# Patient Record
Sex: Male | Born: 1980 | Race: Black or African American | Hispanic: No | Marital: Married | Smoking: Current every day smoker
Health system: Southern US, Community
[De-identification: ages and names within clinical notes are randomized; demographics above are authoritative.]

## PROBLEM LIST (undated history)

## (undated) HISTORY — PX: HAND SURGERY: SHX662

---

## 2008-07-21 ENCOUNTER — Emergency Department: Payer: Self-pay | Admitting: Emergency Medicine

## 2009-08-25 ENCOUNTER — Emergency Department: Payer: Self-pay | Admitting: Unknown Physician Specialty

## 2011-01-05 ENCOUNTER — Emergency Department: Payer: Self-pay | Admitting: Emergency Medicine

## 2015-05-13 ENCOUNTER — Encounter: Payer: Self-pay | Admitting: Emergency Medicine

## 2015-05-13 ENCOUNTER — Emergency Department: Payer: Self-pay

## 2015-05-13 ENCOUNTER — Inpatient Hospital Stay
Admission: EM | Admit: 2015-05-13 | Discharge: 2015-05-13 | DRG: 558 | Disposition: A | Discharge status: Home / Self Care | Payer: Self-pay | Attending: Internal Medicine | Admitting: Internal Medicine

## 2015-05-13 DIAGNOSIS — Z9889 Other specified postprocedural states: Secondary | ICD-10-CM

## 2015-05-13 DIAGNOSIS — R079 Chest pain, unspecified: Secondary | ICD-10-CM

## 2015-05-13 DIAGNOSIS — F1510 Other stimulant abuse, uncomplicated: Secondary | ICD-10-CM

## 2015-05-13 DIAGNOSIS — Z8249 Family history of ischemic heart disease and other diseases of the circulatory system: Secondary | ICD-10-CM

## 2015-05-13 DIAGNOSIS — E860 Dehydration: Secondary | ICD-10-CM | POA: Diagnosis present

## 2015-05-13 DIAGNOSIS — M6282 Rhabdomyolysis: Principal | ICD-10-CM | POA: Diagnosis present

## 2015-05-13 DIAGNOSIS — F17200 Nicotine dependence, unspecified, uncomplicated: Secondary | ICD-10-CM | POA: Diagnosis present

## 2015-05-13 DIAGNOSIS — F1590 Other stimulant use, unspecified, uncomplicated: Secondary | ICD-10-CM | POA: Diagnosis present

## 2015-05-13 DIAGNOSIS — R252 Cramp and spasm: Secondary | ICD-10-CM

## 2015-05-13 LAB — COMPREHENSIVE METABOLIC PANEL
ALBUMIN: 4.1 g/dL (ref 3.5–5.0)
ALK PHOS: 56 U/L (ref 38–126)
ALT: 26 U/L (ref 17–63)
ALT: 21 U/L (ref 17–63)
AST: 50 U/L — ABNORMAL HIGH (ref 15–41)
AST: 36 U/L (ref 15–41)
Albumin: 5.4 g/dL — ABNORMAL HIGH (ref 3.5–5.0)
Alkaline Phosphatase: 75 U/L (ref 38–126)
Anion gap: 19 — ABNORMAL HIGH (ref 5–15)
Anion gap: 8 (ref 5–15)
BILIRUBIN TOTAL: 1.8 mg/dL — AB (ref 0.3–1.2)
BUN: 7 mg/dL (ref 6–20)
BUN: 7 mg/dL (ref 6–20)
CALCIUM: 11 mg/dL — AB (ref 8.9–10.3)
CALCIUM: 9.3 mg/dL (ref 8.9–10.3)
CHLORIDE: 95 mmol/L — AB (ref 101–111)
CO2: 26 mmol/L (ref 22–32)
CO2: 26 mmol/L (ref 22–32)
CREATININE: 1.04 mg/dL (ref 0.61–1.24)
CREATININE: 0.75 mg/dL (ref 0.61–1.24)
Chloride: 102 mmol/L (ref 101–111)
GFR calc Af Amer: >60 mL/min (ref >60)
GFR calc non Af Amer: >60 mL/min (ref >60)
GLUCOSE: 102 mg/dL — AB (ref 65–99)
Glucose, Bld: 117 mg/dL — ABNORMAL HIGH (ref 65–99)
Potassium: 4.8 mmol/L (ref 3.5–5.1)
Potassium: 3.9 mmol/L (ref 3.5–5.1)
SODIUM: 136 mmol/L (ref 135–145)
Sodium: 140 mmol/L (ref 135–145)
TOTAL PROTEIN: 9.4 g/dL — AB (ref 6.5–8.1)
TOTAL PROTEIN: 7.3 g/dL (ref 6.5–8.1)
Total Bilirubin: 1.7 mg/dL — ABNORMAL HIGH (ref 0.3–1.2)

## 2015-05-13 LAB — URINALYSIS COMPLETE WITH MICROSCOPIC (ARMC ONLY)
BILIRUBIN URINE: NEGATIVE
Bacteria, UA: NONE SEEN
Glucose, UA: NEGATIVE mg/dL
HGB URINE DIPSTICK: NEGATIVE
Leukocytes, UA: NEGATIVE
Nitrite: NEGATIVE
PH: 6 (ref 5.0–8.0)
Protein, ur: 100 mg/dL — AB
Specific Gravity, Urine: 1.028 (ref 1.005–1.030)
Squamous Epithelial / LPF: NONE SEEN

## 2015-05-13 LAB — CBC WITH DIFFERENTIAL/PLATELET
Basophils Absolute: 0.1 10*3/uL (ref 0–0.1)
Basophils Relative: 1 %
EOS PCT: 0 %
Eosinophils Absolute: 0 10*3/uL (ref 0–0.7)
HCT: 47 % (ref 40.0–52.0)
Hemoglobin: 15.9 g/dL (ref 13.0–18.0)
LYMPHS ABS: 2.7 10*3/uL (ref 1.0–3.6)
LYMPHS PCT: 31 %
MCH: 34.2 pg — AB (ref 26.0–34.0)
MCHC: 33.8 g/dL (ref 32.0–36.0)
MCV: 101.3 fL — AB (ref 80.0–100.0)
MONO ABS: 0.7 10*3/uL (ref 0.2–1.0)
MONOS PCT: 9 %
Neutro Abs: 5.1 10*3/uL (ref 1.4–6.5)
Neutrophils Relative %: 59 %
PLATELETS: 347 10*3/uL (ref 150–440)
RBC: 4.64 MIL/uL (ref 4.40–5.90)
RDW: 12.3 % (ref 11.5–14.5)
WBC: 8.6 10*3/uL (ref 3.8–10.6)

## 2015-05-13 LAB — URINE DRUG SCREEN, QUALITATIVE (ARMC ONLY)
Amphetamines, Ur Screen: POSITIVE — AB
BARBITURATES, UR SCREEN: NOT DETECTED
BENZODIAZEPINE, UR SCRN: NOT DETECTED
Cannabinoid 50 Ng, Ur ~~LOC~~: POSITIVE — AB
Cocaine Metabolite,Ur ~~LOC~~: NOT DETECTED
MDMA (Ecstasy)Ur Screen: NOT DETECTED
METHADONE SCREEN, URINE: NOT DETECTED
OPIATE, UR SCREEN: NOT DETECTED
PHENCYCLIDINE (PCP) UR S: NOT DETECTED
Tricyclic, Ur Screen: NOT DETECTED

## 2015-05-13 LAB — ETHANOL: Alcohol, Ethyl (B): <5 mg/dL (ref <5)

## 2015-05-13 LAB — TROPONIN I: Troponin I: <0.03 ng/mL (ref <0.031)

## 2015-05-13 LAB — CK
Total CK: 1121 U/L — ABNORMAL HIGH (ref 49–397)
Total CK: 770 U/L — ABNORMAL HIGH (ref 49–397)

## 2015-05-13 LAB — ACETAMINOPHEN LEVEL: Acetaminophen (Tylenol), Serum: <10 ug/mL — ABNORMAL LOW (ref 10–30)

## 2015-05-13 LAB — TSH: TSH: 0.904 u[IU]/mL (ref 0.350–4.500)

## 2015-05-13 LAB — SALICYLATE LEVEL: Salicylate Lvl: <4 mg/dL (ref 2.8–30.0)

## 2015-05-13 MED ORDER — DOCUSATE SODIUM 100 MG PO CAPS: 100.0000 mg | ORAL_CAPSULE | Freq: Two times a day (BID) | ORAL | Status: DC

## 2015-05-13 MED ORDER — LORAZEPAM 2 MG/ML IJ SOLN
1.0000 mg | Freq: Once | INTRAMUSCULAR | Status: AC
  Administered 2015-05-13: 1 mg via INTRAVENOUS
  Filled 2015-05-13: qty 1

## 2015-05-13 MED ORDER — MORPHINE SULFATE (PF) 2 MG/ML IV SOLN: 2.0000 mg | INTRAVENOUS | Status: DC | PRN

## 2015-05-13 MED ORDER — ONDANSETRON HCL 4 MG/2ML IJ SOLN: 4.0000 mg | Freq: Four times a day (QID) | INTRAMUSCULAR | Status: DC | PRN

## 2015-05-13 MED ORDER — SODIUM CHLORIDE 0.9 % IV BOLUS (SEPSIS)
1000.0000 mL | Freq: Once | INTRAVENOUS | Status: AC
  Administered 2015-05-13: 1000 mL via INTRAVENOUS

## 2015-05-13 MED ORDER — ONDANSETRON HCL 4 MG PO TABS: 4.0000 mg | ORAL_TABLET | Freq: Four times a day (QID) | ORAL | Status: DC | PRN

## 2015-05-13 MED ORDER — SODIUM CHLORIDE 0.9 % IV SOLN
INTRAVENOUS | Status: DC
  Administered 2015-05-13: 06:00:00 via INTRAVENOUS

## 2015-05-13 MED ORDER — ACETAMINOPHEN 325 MG PO TABS: 650.0000 mg | ORAL_TABLET | Freq: Four times a day (QID) | ORAL | Status: DC | PRN

## 2015-05-13 MED ORDER — ACETAMINOPHEN 650 MG RE SUPP: 650.0000 mg | Freq: Four times a day (QID) | RECTAL | Status: DC | PRN

## 2015-05-13 MED ORDER — HEPARIN SODIUM (PORCINE) 5000 UNIT/ML IJ SOLN
5000.0000 [IU] | Freq: Three times a day (TID) | INTRAMUSCULAR | Status: DC
  Administered 2015-05-13: 5000 [IU] via SUBCUTANEOUS
  Filled 2015-05-13: qty 1

## 2015-05-13 MED ORDER — SODIUM CHLORIDE 0.9% FLUSH: 3.0000 mL | Freq: Two times a day (BID) | INTRAVENOUS | Status: DC

## 2015-05-13 NOTE — Progress Notes (Signed)
Arrival Method: via stretcher with ED techs Mental Orientation: A&O Telemetry: [id number] verified with takera nesbitt, RN Skin: verified by takera nesbitt, RN. Skin intact with tattoos IV: 20g left forearm Pain:  No pain Tubes:  NS @ 172ml/hr Safety Measures: Safety Fall Prevention Plan has been given, discussed & signed, non skid socks in place. 2A Orientation: Patient has been orientated to the room, unit & staff.  Family: Has been informed of plan of care.  Orders have been reviewed & implemented. Will continue to monitor the patient. Call light has been placed within reach.  Eden Lathe, RN

## 2015-05-13 NOTE — Progress Notes (Signed)
Discharge instructions explained to pt and pts spouse/ verbalized an understanding/ iv and tele removed/ transported off unit via wheelchair.  

## 2015-05-13 NOTE — ED Provider Notes (Signed)
[facility] Emergency Department Provider Note  ____________________________________________  Time seen: Approximately 2:14 AM  I have reviewed the triage vital signs and the nursing notes.   HISTORY  Chief Complaint Chest Pain and Back Pain    HPI Nathan Weiss. is a 35 y.o. male who presents to the ED from home with a chief complain of back pain, cramping and chest pain. Patient reports he has had N/V/D all day yesterday. Complains of lower back pain, bilateral hand cramping as well as chest pain. Patient is vague regarding onset and intensity of symptoms. Denies associated fever, chills, shortness of breath, abdominal pain, dysuria. Denies recent travel or trauma. Nothing makes his symptoms better or worse.   History reviewed. No pertinent past medical history.  There are no active problems to display for this patient.   Past Surgical History  Procedure Laterality Date  . Hand surgery      No current outpatient prescriptions on file.  Allergies Review of patient's allergies indicates no known allergies.  No family history on file.  Social History Social History  Substance Use Topics  . Smoking status: Current Every Day Smoker -- 0.50 packs/day  . Smokeless tobacco: Never Used  . Alcohol Use: 1.8 oz/week    3 Cans of beer per week  + recent alcohol  Review of Systems Constitutional: No fever/chills Eyes: No visual changes. ENT: No sore throat. Cardiovascular: Denies chest pain. Respiratory: Denies shortness of breath. Gastrointestinal: No abdominal pain.  Positive for nausea, vomiting and diarrhea.  No constipation. Genitourinary: Negative for dysuria. Musculoskeletal: Positive for back pain and bilateral hand cramping. Skin: Negative for rash. Neurological: Negative for headaches, focal weakness or numbness.  10-point ROS otherwise negative.  ____________________________________________   PHYSICAL EXAM:  VITAL  SIGNS: ED Triage Vitals  Enc Vitals Group     BP --      Pulse --      Resp --      Temp --      Temp src --      SpO2 --      Weight --      Height --      Head Cir --      Peak Flow --      Pain Score --      Pain Loc --      Pain Edu? --      Excl. in GC? --     Constitutional: Alert and oriented. Well appearing and in mild acute distress. Eyes: Conjunctivae are normal. PERRL. EOMI. Head: Atraumatic. Nose: No congestion/rhinnorhea. Mouth/Throat: Mucous membranes are moist.  Oropharynx non-erythematous. Neck: No stridor.   Cardiovascular: Normal rate, regular rhythm. Grossly normal heart sounds.  Good peripheral circulation. Respiratory: Normal respiratory effort.  No retractions. Lungs CTAB. Gastrointestinal: Soft and nontender. No distention. No abdominal bruits. No CVA tenderness. Musculoskeletal: Bilateral hands actively cramping. No spinal tenderness to palpation. No lower extremity tenderness nor edema.  No joint effusions. Neurologic:  Normal speech and language. No gross focal neurologic deficits are appreciated. No gait instability. Skin:  Skin is warm, diaphoretic and intact. No rash noted. Psychiatric: Mood and affect are normal. Speech and behavior are normal.  ____________________________________________   LABS (all labs ordered are listed, but only abnormal results are displayed)  Labs Reviewed  CBC WITH DIFFERENTIAL/PLATELET - Abnormal; Notable for the following:    MCV 101.3 (*)    MCH 34.2 (*)    All other components within normal limits  COMPREHENSIVE METABOLIC PANEL - Abnormal; Notable for the following:    Chloride 95 (*)    Glucose, Bld 117 (*)    Calcium 11.0 (*)    Total Protein 9.4 (*)    Albumin 5.4 (*)    AST 50 (*)    Total Bilirubin 1.7 (*)    Anion gap 19 (*)    All other components within normal limits  ACETAMINOPHEN LEVEL - Abnormal; Notable for the following:    Acetaminophen (Tylenol), Serum <10 (*)    All other components  within normal limits  CK - Abnormal; Notable for the following:    Total CK 1121 (*)    All other components within normal limits  URINALYSIS COMPLETEWITH MICROSCOPIC ([facility] ONLY) - Abnormal; Notable for the following:    Color, Urine AMBER (*)    APPearance CLEAR (*)    Ketones, ur 2+ (*)    Protein, ur 100 (*)    All other components within normal limits  URINE DRUG SCREEN, QUALITATIVE ([facility] ONLY) - Abnormal; Notable for the following:    Amphetamines, Ur Screen POSITIVE (*)    Cannabinoid 50 Ng, Ur [place] POSITIVE (*)    All other components within normal limits  ETHANOL  TROPONIN I  SALICYLATE LEVEL  MYOGLOBIN, URINE   ____________________________________________  EKG  ED ECG REPORT I, Ferrin Liebig J, the attending physician, personally viewed and interpreted this ECG.   Date: 05/13/2015  EKG Time: 0216  Rate: 74  Rhythm: normal EKG, normal sinus rhythm  Axis: Normal  Intervals:none  ST&T Change: Nonspecific  ____________________________________________  RADIOLOGY  Portable chest x-ray (viewed by me, interpreted per Dr. Cherly Hensen): No acute cardiopulmonary process seen. ____________________________________________   PROCEDURES  Procedure(s) performed: None  Critical Care performed: No  ____________________________________________   INITIAL IMPRESSION / ASSESSMENT AND PLAN / ED COURSE  Pertinent labs & imaging results that were available during my care of the patient were reviewed by me and considered in my medical decision making (see chart for details).  35 year old male who presents with back pain, and cramping in chest pain. He is elusive when queried regarding illicit drug use. Will obtain screening lab work including CK, toxicological screen and initiate IV fluid resuscitation with benzodiazepine to relieve hand cramping.  ----------------------------------------- 3:46 AM on 05/13/2015 -----------------------------------------  Patient more relaxed; hand  cramping much improved. Updated patient and girlfriend of laboratory and imaging results. Girlfriend states patient used ecstasy 2 nights ago. Discussed with hospitalist tonight patient in the ED for admission. ____________________________________________   FINAL CLINICAL IMPRESSION(S) / ED DIAGNOSES  Final diagnoses:  Methamphetamine use  Non-traumatic rhabdomyolysis  Muscle cramping  Chest pain, unspecified chest pain type  Dehydration      Irean Hong, MD 05/13/15 [phone]

## 2015-05-13 NOTE — ED Notes (Signed)
Pt reports that he has been vomiting x2 days. Pt reports that his chest "hurt right over my heart". Per pt hands and feet have been experiencing cramping. Pt is a/o at this time.

## 2015-05-13 NOTE — H&P (Addendum)
Nathan Weiss. is an 35 y.o. male.   Chief Complaint: Chest pain HPI: The patient presents to the emergency department complaining of chest pain, back pain and arm pain. He states the pain is a deep ache and is not associated with shortness of breath, nausea, vomiting or diaphoresis. He states his pain has waxed and waned as well as changed locations since earlier this evening. At present, his chest and arm no longer hurt but his back now hurts. The pain does not radiate anywhere. Operatory evaluation in the emergency department revealed elevated creatinine kinase consistent with rhabdomyolysis which prompted the emergency department staff to call for admission.  History reviewed. No pertinent past medical history. None  Past Surgical History  Procedure Laterality Date  . Hand surgery      Family History  Problem Relation Age of Onset  . Hypertension Maternal Grandmother    Social History:  reports that he has been smoking.  He has never used smokeless tobacco. He reports that he drinks about 1.8 oz of alcohol per week. He reports that he does not use illicit drugs.  Allergies: No Known Allergies  Prior to Admission medications   Not on File   None  Results for orders placed or performed during the hospital encounter of 05/13/15 (from the past 48 hour(s))  CBC with Differential     Status: Abnormal   Collection Time: 05/13/15  2:21 AM  Result Value Ref Range   WBC 8.6 3.8 - 10.6 K/uL   RBC 4.64 4.40 - 5.90 MIL/uL   Hemoglobin 15.9 13.0 - 18.0 g/dL   HCT 47.0 40.0 - 52.0 %   MCV 101.3 (H) 80.0 - 100.0 fL   MCH 34.2 (H) 26.0 - 34.0 pg   MCHC 33.8 32.0 - 36.0 g/dL   RDW 12.3 11.5 - 14.5 %   Platelets 347 150 - 440 K/uL   Neutrophils Relative % 59 %   Neutro Abs 5.1 1.4 - 6.5 K/uL   Lymphocytes Relative 31 %   Lymphs Abs 2.7 1.0 - 3.6 K/uL   Monocytes Relative 9 %   Monocytes Absolute 0.7 0.2 - 1.0 K/uL   Eosinophils Relative 0 %   Eosinophils Absolute 0.0 0 - 0.7 K/uL    Basophils Relative 1 %   Basophils Absolute 0.1 0 - 0.1 K/uL  Comprehensive metabolic panel     Status: Abnormal   Collection Time: 05/13/15  2:21 AM  Result Value Ref Range   Sodium 140 135 - 145 mmol/L   Potassium 4.8 3.5 - 5.1 mmol/L   Chloride 95 (L) 101 - 111 mmol/L   CO2 26 22 - 32 mmol/L   Glucose, Bld 117 (H) 65 - 99 mg/dL   BUN 7 6 - 20 mg/dL   Creatinine, Ser 1.04 0.61 - 1.24 mg/dL   Calcium 11.0 (H) 8.9 - 10.3 mg/dL   Total Protein 9.4 (H) 6.5 - 8.1 g/dL   Albumin 5.4 (H) 3.5 - 5.0 g/dL   AST 50 (H) 15 - 41 U/L   ALT 26 17 - 63 U/L   Alkaline Phosphatase 75 38 - 126 U/L   Total Bilirubin 1.7 (H) 0.3 - 1.2 mg/dL   GFR calc non Af Amer >60 >60 mL/min   GFR calc Af Amer >60 >60 mL/min    Comment: (NOTE) The eGFR has been calculated using the CKD EPI equation. This calculation has not been validated in all clinical situations. eGFR's persistently <60 mL/min signify possible Chronic Kidney  Disease.    Anion gap 19 (H) 5 - 15  Ethanol     Status: None   Collection Time: 05/13/15  2:21 AM  Result Value Ref Range   Alcohol, Ethyl (B) <5 <5 mg/dL    Comment:        LOWEST DETECTABLE LIMIT FOR SERUM ALCOHOL IS 5 mg/dL FOR MEDICAL PURPOSES ONLY   Troponin I     Status: None   Collection Time: 05/13/15  2:21 AM  Result Value Ref Range   Troponin I <0.03 <0.031 ng/mL    Comment:        NO INDICATION OF MYOCARDIAL INJURY.   Acetaminophen level     Status: Abnormal   Collection Time: 05/13/15  2:21 AM  Result Value Ref Range   Acetaminophen (Tylenol), Serum <10 (L) 10 - 30 ug/mL    Comment:        THERAPEUTIC CONCENTRATIONS VARY SIGNIFICANTLY. A RANGE OF 10-30 ug/mL MAY BE AN EFFECTIVE CONCENTRATION FOR MANY PATIENTS. HOWEVER, SOME ARE BEST TREATED AT CONCENTRATIONS OUTSIDE THIS RANGE. ACETAMINOPHEN CONCENTRATIONS >150 ug/mL AT 4 HOURS AFTER INGESTION AND >50 ug/mL AT 12 HOURS AFTER INGESTION ARE OFTEN ASSOCIATED WITH TOXIC REACTIONS.   Salicylate level      Status: None   Collection Time: 05/13/15  2:21 AM  Result Value Ref Range   Salicylate Lvl <5.8 2.8 - 30.0 mg/dL  CK     Status: Abnormal   Collection Time: 05/13/15  2:21 AM  Result Value Ref Range   Total CK 1121 (H) 49 - 397 U/L  Urinalysis complete, with microscopic ([facility] only)     Status: Abnormal   Collection Time: 05/13/15  2:21 AM  Result Value Ref Range   Color, Urine AMBER (A) YELLOW   APPearance CLEAR (A) CLEAR   Glucose, UA NEGATIVE NEGATIVE mg/dL   Bilirubin Urine NEGATIVE NEGATIVE   Ketones, ur 2+ (A) NEGATIVE mg/dL   Specific Gravity, Urine 1.028 1.005 - 1.030   Hgb urine dipstick NEGATIVE NEGATIVE   pH 6.0 5.0 - 8.0   Protein, ur 100 (A) NEGATIVE mg/dL   Nitrite NEGATIVE NEGATIVE   Leukocytes, UA NEGATIVE NEGATIVE   RBC / HPF 0-5 0 - 5 RBC/hpf   WBC, UA 0-5 0 - 5 WBC/hpf   Bacteria, UA NONE SEEN NONE SEEN   Squamous Epithelial / LPF NONE SEEN NONE SEEN   Mucous PRESENT    Hyaline Casts, UA PRESENT   Urine Drug Screen, Qualitative ([facility] only)     Status: Abnormal   Collection Time: 05/13/15  2:21 AM  Result Value Ref Range   Tricyclic, Ur Screen NONE DETECTED NONE DETECTED   Amphetamines, Ur Screen POSITIVE (A) NONE DETECTED   MDMA (Ecstasy)Ur Screen NONE DETECTED NONE DETECTED   Cocaine Metabolite,Ur [place] NONE DETECTED NONE DETECTED   Opiate, Ur Screen NONE DETECTED NONE DETECTED   Phencyclidine (PCP) Ur S NONE DETECTED NONE DETECTED   Cannabinoid 50 Ng, Ur [place] POSITIVE (A) NONE DETECTED   Barbiturates, Ur Screen NONE DETECTED NONE DETECTED   Benzodiazepine, Ur Scrn NONE DETECTED NONE DETECTED   Methadone Scn, Ur NONE DETECTED NONE DETECTED    Comment: (NOTE) 099  Tricyclics, urine               Cutoff 1000 ng/mL 200  Amphetamines, urine             Cutoff 1000 ng/mL 300  MDMA (Ecstasy), urine           Cutoff  500 ng/mL 400  Cocaine Metabolite, urine       Cutoff 300 ng/mL 500  Opiate, urine                   Cutoff 300 ng/mL 600  Phencyclidine (PCP),  urine      Cutoff 25 ng/mL 700  Cannabinoid, urine              Cutoff 50 ng/mL 800  Barbiturates, urine             Cutoff 200 ng/mL 900  Benzodiazepine, urine           Cutoff 200 ng/mL 1000 Methadone, urine                Cutoff 300 ng/mL 1100 1200 The urine drug screen provides only a preliminary, unconfirmed 1300 analytical test result and should not be used for non-medical 1400 purposes. Clinical consideration and professional judgment should 1500 be applied to any positive drug screen result due to possible 1600 interfering substances. A more specific alternate chemical method 1700 must be used in order to obtain a confirmed analytical result.  1800 Gas chromato graphy / mass spectrometry (GC/MS) is the preferred 1900 confirmatory method.    Dg Chest Port 1 View  05/13/2015  CLINICAL DATA:  Acute onset of generalized chest pain and shortness of breath. Abdominal cramping. Initial encounter. EXAM: PORTABLE CHEST 1 VIEW COMPARISON:  None. FINDINGS: The lungs are well-aerated and clear. There is no evidence of focal opacification, pleural effusion or pneumothorax. The cardiomediastinal silhouette is within normal limits. No acute osseous abnormalities are seen. IMPRESSION: No acute cardiopulmonary process seen. Electronically Signed   By: Garald Balding M.D.   On: 05/13/2015 02:28    Review of Systems  Constitutional: Negative for fever and chills.  HENT: Negative for sore throat and tinnitus.   Eyes: Negative for blurred vision and redness.  Respiratory: Negative for cough and shortness of breath.   Cardiovascular: Positive for chest pain. Negative for palpitations, orthopnea and PND.  Gastrointestinal: Negative for nausea, vomiting, abdominal pain and diarrhea.  Genitourinary: Negative for dysuria, urgency and frequency.  Musculoskeletal: Positive for myalgias. Negative for joint pain.  Skin: Negative for rash.       No lesions  Neurological: Negative for speech change, focal  weakness and weakness.  Endo/Heme/Allergies: Does not bruise/bleed easily.       No temperature intolerance  Psychiatric/Behavioral: Negative for depression and suicidal ideas.    Blood pressure 120/79, pulse 70, temperature 97.9 F (36.6 C), temperature source Oral, resp. rate 15, height _0  (1.727 m), weight 65.772 kg (145 lb), SpO2 99 %. Physical Exam  Nursing note and vitals reviewed. Constitutional: He is oriented to person, place, and time. He appears well-developed and well-nourished. No distress.  HENT:  Head: Normocephalic and atraumatic.  Mouth/Throat: Oropharynx is clear and moist.  Eyes: Conjunctivae and EOM are normal. Pupils are equal, round, and reactive to light. No scleral icterus.  Neck: Normal range of motion. Neck supple. No JVD present. No tracheal deviation present. No thyromegaly present.  Cardiovascular: Normal rate, regular rhythm and normal heart sounds.  Exam reveals no gallop and no friction rub.   No murmur heard. Respiratory: Effort normal and breath sounds normal. No respiratory distress.  GI: Soft. Bowel sounds are normal. He exhibits no distension. There is no tenderness.  Genitourinary:  Deferred  Musculoskeletal: Normal range of motion. He exhibits no edema.  Lymphadenopathy:    He has no  cervical adenopathy.  Neurological: He is alert and oriented to person, place, and time. No cranial nerve deficit.  Skin: Skin is warm and dry. No rash noted. No erythema.  Psychiatric: He has a normal mood and affect. His behavior is normal. Judgment and thought content normal.     Assessment/Plan This is a 35 year old [place] American male admitted for rhabdomyolysis. 1. Rhabdomyolysis: The patient admits to MDMA usage; afebrile and telemetry normal. Potassium and renal function within normal limits at this time. Aggressively hydrate with intravenous fluid. Continue to check cardiac enzymes although chest pain is likely musculoskeletal. No EKG changes indicating  myocardial ischemia. Monitor I's and O's to quantify urine output. No Foley necessary at this time. 2. DVT prophylaxis: Heparin 3. GI prophylaxis: None as the patient is not critically ill The patient is a full code. Time spent on admission and patient care approximately 45 minutes  Harrie Foreman 05/13/2015, 4:04 AM

## 2015-05-13 NOTE — ED Notes (Signed)
Called to telemetry about assigned bed. Per [employer]omatic Services operational officer, they are still talking about assignment.

## 2015-05-13 NOTE — ED Notes (Signed)
Pt transported to room 250 

## 2015-05-13 NOTE — Discharge Instructions (Signed)
Regular diet and activity.  No new medications.

## 2015-05-13 NOTE — Discharge Summary (Signed)
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 No pertinent past medical history.   ADMITTING HISTORY  Chief Complaint: Chest pain HPI: The patient presents to the emergency department complaining of chest pain, back pain and arm pain. He states the pain is a deep ache and is not associated with shortness of breath, nausea, vomiting or diaphoresis. He states his pain has waxed and waned as well as changed locations since earlier this evening. At present, his chest and arm no longer hurt but his back now hurts. The pain does not radiate anywhere. Operatory evaluation in the emergency department revealed elevated creatinine kinase consistent with rhabdomyolysis which prompted the emergency department staff to call for admission.   HOSPITAL COURSE:   Patient was admitted to telemetry floor due to his pain in the chest. And started on aggressive IV fluid resuscitation for his rhabdomyolysis. Patient's cardiac enzymes are normal and EKG unchanged. His chest pain was pleuritic and likely musculoskeletal. His rhabdomyolysis was due to using ecstasy and getting dehydrated. CK has trended down well. No renal failure found. Patient is being discharged home in stable condition without any new medications. Counseled to avoid any illicit drug use in the future.   CONSULTS OBTAINED:     DRUG ALLERGIES:  No Known Allergies  DISCHARGE MEDICATIONS:  There are no  discharge medications for this patient.    Today    VITAL SIGNS:  Blood pressure 118/70, pulse 86, temperature 98.4 F (36.9 C), temperature source Oral, resp. rate 18, height  (1.727 m), weight 59.829 kg (131 lb 14.4 oz), SpO2 98 %.  I/O:   Intake/Output Summary (Last 24 hours) at 05/13/15 1605 Last data filed at 05/13/15 4098  Gross per 24 hour  Intake      0 ml  Output    225 ml  Net   -225 ml    PHYSICAL EXAMINATION:  Physical Exam  GENERAL:  35 y.o.-year-old patient lying in the bed with no acute distress.  LUNGS: Normal breath sounds bilaterally, no wheezing, rales,rhonchi or crepitation. No use of accessory muscles of respiration.  CARDIOVASCULAR: S1, S2 normal. No murmurs, rubs, or gallops.  ABDOMEN: Soft, non-tender, non-distended. Bowel sounds present. No organomegaly or mass.  NEUROLOGIC: Moves all 4 extremities. PSYCHIATRIC: The patient is alert and oriented x 3.  SKIN: No obvious rash, lesion, or ulcer.   DATA REVIEW:   CBC  Recent Labs Lab 05/13/15 0221  WBC 8.6  HGB 15.9  HCT 47.0  PLT 347    Chemistries   Recent Labs Lab 05/13/15 1057  NA 136  K 3.9  CL 102  CO2 26  GLUCOSE 102*  BUN 7  CREATININE 0.75  CALCIUM 9.3  AST 36  ALT 21  ALKPHOS 56  BILITOT 1.8*    Cardiac Enzymes  Recent Labs Lab 05/13/15 0635  TROPONINI <0.03    Microbiology Results  No results found for this or any previous visit.  RADIOLOGY:  Dg Chest Port 1 [address]  05/13/2015  CLINICAL DATA:  Acute onset of generalized chest pain and shortness of breath. Abdominal cramping. Initial encounter. EXAM: PORTABLE CHEST 1 VIEW COMPARISON:  None. FINDINGS: The lungs are well-aerated and clear. There is no evidence of focal opacification, pleural effusion or pneumothorax. The cardiomediastinal silhouette is within normal limits. No acute osseous abnormalities are seen. IMPRESSION: No acute cardiopulmonary process seen. Electronically Signed   By: Roanna Raider  M.D.   On: 05/13/2015 02:28      Follow up with PCP in 1 week.  Management plans discussed with the patient, family and they are in agreement.  CODE STATUS:     Code Status Orders        Start     Ordered   05/13/15 0532  Full code   Continuous     05/13/15 0531    Code Status History    Date Active Date Inactive Code Status Order ID Comments User Context   This patient has a current code status but no historical code status.      TOTAL TIME TAKING CARE OF THIS PATIENT ON DAY OF DISCHARGE: more than 30 minutes.    Milagros Loll R M.D on 05/13/2015 at 4:05 PM  Between 7am to 6pm - Pager - [phone]  After 6pm go to [email or web address] - password [email or web address] [facility]  [facility]  Office  [phone]  CC: Primary care physician; No PCP Per Patient     Note: This dictation was prepared with Dragon dictation along with smaller phrase technology. Any transcriptional errors that result from this process are unintentional.

## 2015-05-14 LAB — HEMOGLOBIN A1C: Hgb A1c MFr Bld: 4.7 % (ref 4.0–6.0)

## 2015-05-14 LAB — MYOGLOBIN, URINE: MYOGLOBIN UR: 14 ng/mL — AB (ref 0–13)

## 2017-06-23 ENCOUNTER — Other Ambulatory Visit: Payer: Self-pay

## 2017-06-23 ENCOUNTER — Emergency Department
Admission: EM | Admit: 2017-06-23 | Discharge: 2017-06-23 | Disposition: A | Discharge status: Home / Self Care | Payer: Self-pay | Attending: Student in an Organized Health Care Education/Training Program | Admitting: Student in an Organized Health Care Education/Training Program

## 2017-06-23 ENCOUNTER — Encounter: Payer: Self-pay | Admitting: *Deleted

## 2017-06-23 DIAGNOSIS — F1029 Alcohol dependence with unspecified alcohol-induced disorder: Secondary | ICD-10-CM | POA: Insufficient documentation

## 2017-06-23 DIAGNOSIS — E860 Dehydration: Secondary | ICD-10-CM | POA: Insufficient documentation

## 2017-06-23 DIAGNOSIS — F17210 Nicotine dependence, cigarettes, uncomplicated: Secondary | ICD-10-CM | POA: Insufficient documentation

## 2017-06-23 LAB — BASIC METABOLIC PANEL
Anion gap: 13 (ref 5–15)
BUN: 7 mg/dL (ref 6–20)
CHLORIDE: 99 mmol/L — AB (ref 101–111)
CO2: 24 mmol/L (ref 22–32)
CREATININE: 1.58 mg/dL — AB (ref 0.61–1.24)
Calcium: 9.3 mg/dL (ref 8.9–10.3)
GFR calc Af Amer: >60 mL/min (ref >60)
GFR, EST NON AFRICAN AMERICAN: 55 mL/min — AB (ref >60)
GLUCOSE: 110 mg/dL — AB (ref 65–99)
Potassium: 3.3 mmol/L — ABNORMAL LOW (ref 3.5–5.1)
SODIUM: 136 mmol/L (ref 135–145)

## 2017-06-23 LAB — CBC
HCT: 43.3 % (ref 40.0–52.0)
Hemoglobin: 15 g/dL (ref 13.0–18.0)
MCH: 35.1 pg — ABNORMAL HIGH (ref 26.0–34.0)
MCHC: 34.7 g/dL (ref 32.0–36.0)
MCV: 101.1 fL — AB (ref 80.0–100.0)
PLATELETS: 269 10*3/uL (ref 150–440)
RBC: 4.28 MIL/uL — ABNORMAL LOW (ref 4.40–5.90)
RDW: 11.9 % (ref 11.5–14.5)
WBC: 4 10*3/uL (ref 3.8–10.6)

## 2017-06-23 LAB — ETHANOL: Alcohol, Ethyl (B): 90 mg/dL — ABNORMAL HIGH (ref <10)

## 2017-06-23 LAB — TROPONIN I: Troponin I: <0.03 ng/mL (ref <0.03)

## 2017-06-23 MED ORDER — SODIUM CHLORIDE 0.9 % IV BOLUS (SEPSIS)
1000.0000 mL | Freq: Once | INTRAVENOUS | Status: AC
  Administered 2017-06-23: 1000 mL via INTRAVENOUS

## 2017-06-23 MED ORDER — CHLORDIAZEPOXIDE HCL 25 MG PO CAPS
25.0000 mg | ORAL_CAPSULE | Freq: Once | ORAL | Status: AC
  Administered 2017-06-23: 25 mg via ORAL
  Filled 2017-06-23: qty 1

## 2017-06-23 MED ORDER — THIAMINE HCL 100 MG/ML IJ SOLN
100.0000 mg | Freq: Once | INTRAMUSCULAR | Status: AC
  Administered 2017-06-23: 100 mg via INTRAVENOUS
  Filled 2017-06-23: qty 2

## 2017-06-23 MED ORDER — CHLORDIAZEPOXIDE HCL 25 MG PO CAPS: 25.0000 mg | ORAL_CAPSULE | Freq: Three times a day (TID) | ORAL | 0 refills | Status: AC | PRN

## 2017-06-23 MED ORDER — LORAZEPAM 1 MG PO TABS
1.0000 mg | ORAL_TABLET | Freq: Once | ORAL | Status: AC
  Administered 2017-06-23: 1 mg via ORAL
  Filled 2017-06-23: qty 1

## 2017-06-23 NOTE — ED Triage Notes (Signed)
Pt to ED after EMS reports finding pt sitting in a car for an unknown amount of time. PT reports he was in the car because he is trying to stop drinking alcohol. Pt reports last drink was early this morning after a night of drinking. pT usually has 12+ beers a day. nO other drug use reported. pt presents tearful. NO hx of seizures and no reported seizure like activity from EMS. PT is alert and oriented x 4.   PT reports feeling faint and weak at this time and reports SOB with difficulty "catching breat:. No increased WOB noted. NAD at this time.

## 2017-06-23 NOTE — ED Provider Notes (Signed)
[facility] Emergency Department Provider Note    None    (approximate)  I have reviewed the triage vital signs and the nursing notes.   HISTORY  Chief Complaint Near Syncope and Detox    HPI Nathan Weiss. is a 36 y.o. male history of extensive alcohol abuse with multiple beers every day presents to the ER after several fainting spells in an effort to detox from alcohol.  States his last alcoholic beverage was this morning and states that he keeps on "passing out".  Denies any chest pains.  Does feel slightly more jittery.  No blurry vision or flashing lights.  No numbness or tingling.  States has been taking vitamins that he got at [facility] to help with detox.  Has not sought formal detox therapy.  No history of seizure disorder or heart disease.  History reviewed. No pertinent past medical history. Family History  Problem Relation Age of Onset  . Hypertension Maternal Grandmother    Past Surgical History:  Procedure Laterality Date  . HAND SURGERY     Patient Active Problem List   Diagnosis Date Noted  . Rhabdomyolysis 05/13/2015      Prior to Admission medications   Not on File    Allergies Patient has no known allergies.    Social History Social History   Tobacco Use  . Smoking status: Current Every Day Smoker    Packs/day: 0.50  . Smokeless tobacco: Never Used  Substance Use Topics  . Alcohol use: Yes    Alcohol/week: 7.2 oz    Types: 12 Cans of beer per week  . Drug use: No    Review of Systems Patient denies headaches, rhinorrhea, blurry vision, numbness, shortness of breath, chest pain, edema, cough, abdominal pain, nausea, vomiting, diarrhea, dysuria, fevers, rashes or hallucinations unless otherwise stated above in HPI. ____________________________________________   PHYSICAL EXAM:  VITAL SIGNS: Vitals:   06/23/17 1501 06/23/17 1504  BP: 112/86 112/86  Pulse: 93 95  Resp: 19   Temp: 98.8 F (37.1 C)   SpO2:  98%     Constitutional: Alert and oriented. Well appearing and in no acute distress. Eyes: Conjunctivae are normal.  Head: Atraumatic. Nose: No congestion/rhinnorhea. Mouth/Throat: Mucous membranes are moist.   Neck: No stridor. Painless ROM.  Cardiovascular: Normal rate, regular rhythm. Grossly normal heart sounds.  Good peripheral circulation. Respiratory: Normal respiratory effort.  No retractions. Lungs CTAB. Gastrointestinal: Soft and nontender. No distention. No abdominal bruits. No CVA tenderness. Genitourinary:  Musculoskeletal: No lower extremity tenderness nor edema.  No joint effusions. Neurologic:  Normal speech and language. No gross focal neurologic deficits are appreciated. No facial droop Skin:  Skin is warm, dry and intact. No rash noted. Psychiatric: Mood and affect are normal. Speech and behavior are normal.  ____________________________________________   LABS (all labs ordered are listed, but only abnormal results are displayed)  Results for orders placed or performed during the hospital encounter of 06/23/17 (from the past 24 hour(s))  Basic metabolic panel     Status: Abnormal   Collection Time: 06/23/17  3:15 PM  Result Value Ref Range   Sodium 136 135 - 145 mmol/L   Potassium 3.3 (L) 3.5 - 5.1 mmol/L   Chloride 99 (L) 101 - 111 mmol/L   CO2 24 22 - 32 mmol/L   Glucose, Bld 110 (H) 65 - 99 mg/dL   BUN 7 6 - 20 mg/dL   Creatinine, Ser 1.61 (H) 0.61 - 1.24 mg/dL  Calcium 9.3 8.9 - 10.3 mg/dL   GFR calc non Af Amer 55 (L) >60 mL/min   GFR calc Af Amer >60 >60 mL/min   Anion gap 13 5 - 15  CBC     Status: Abnormal   Collection Time: 06/23/17  3:15 PM  Result Value Ref Range   WBC 4.0 3.8 - 10.6 K/uL   RBC 4.28 (L) 4.40 - 5.90 MIL/uL   Hemoglobin 15.0 13.0 - 18.0 g/dL   HCT 16.143.3 09.640.0 - 04.552.0 %   MCV 101.1 (H) 80.0 - 100.0 fL   MCH 35.1 (H) 26.0 - 34.0 pg   MCHC 34.7 32.0 - 36.0 g/dL   RDW 40.911.9 81.111.5 - 91.414.5 %   Platelets 269 150 - 440 K/uL  Ethanol      Status: Abnormal   Collection Time: 06/23/17  3:15 PM  Result Value Ref Range   Alcohol, Ethyl (B) 90 (H) <10 mg/dL   ____________________________________________  EKG My review and personal interpretation at Time: 15:05   Indication: syncope  Rate: 90  Rhythm: sinus Axis: normal Other: normal intervals, no stemi, nonspecific st abn ____________________________________________  RADIOLOGY   ____________________________________________   PROCEDURES  Procedure(s) performed:  Procedures    Critical Care performed: no ____________________________________________   INITIAL IMPRESSION / ASSESSMENT AND PLAN / ED COURSE  Pertinent labs & imaging results that were available during my care of the patient were reviewed by me and considered in my medical decision making (see chart for details).  DDX: withdrawl, dts, seizure, dysrhythmia, dehydration  Nathan L Parke Simmerstwater Jr. is a 37 y.o. who presents to the ED with sxs as described above.  Patient is AFVSS in ED. Exam as above. Given current presentation have considered the above differential.  Well appearing but does appear slightly dehydrated and intoxicated.   Neuro examam is intact.  No evidence of withdrawal.  The patient will be placed on continuous pulse oximetry and telemetry for monitoring.  Laboratory evaluation will be sent to evaluate for the above complaints.     Clinical Course as of Jun 24 1739  Tue Jun 23, 2017  1729 Patient reassessed.  States he feels much improved after IV fluids.  He has no signs or symptoms of withdrawal.  Did discussing recommendation for outpatient detox.  Patient will be given prescription for Librium to assist with detox and patient was informed of the dangers of trying to suddenly stop drinking alcohol.  Patient able to ambulate with a steady gait.  Tolerating oral hydration.  Have discussed with the patient and available family all diagnostics and treatments performed thus far and all questions  were answered to the best of my ability. The patient demonstrates understanding and agreement with plan.   [PR]    Clinical Course User Index [PR] Willy Eddyobinson, Shera Laubach, MD     ____________________________________________   FINAL CLINICAL IMPRESSION(S) / ED DIAGNOSES  Final diagnoses:  Alcohol dependence with unspecified alcohol-induced disorder (HCC)  Dehydration      NEW MEDICATIONS STARTED DURING THIS VISIT:  New Prescriptions   No medications on file     Note:  This document was prepared using Dragon voice recognition software and may include unintentional dictation errors.    Willy Eddyobinson, Shadrick Senne, MD 06/23/17 1757

## 2018-06-08 ENCOUNTER — Other Ambulatory Visit: Payer: Self-pay

## 2018-06-08 ENCOUNTER — Emergency Department: Payer: Self-pay

## 2018-06-08 ENCOUNTER — Emergency Department
Admission: EM | Admit: 2018-06-08 | Discharge: 2018-06-08 | Payer: Self-pay | Attending: Emergency Medicine | Admitting: Emergency Medicine

## 2018-06-08 ENCOUNTER — Encounter: Payer: Self-pay | Admitting: Emergency Medicine

## 2018-06-08 DIAGNOSIS — F419 Anxiety disorder, unspecified: Secondary | ICD-10-CM | POA: Insufficient documentation

## 2018-06-08 DIAGNOSIS — F17210 Nicotine dependence, cigarettes, uncomplicated: Secondary | ICD-10-CM | POA: Insufficient documentation

## 2018-06-08 DIAGNOSIS — R079 Chest pain, unspecified: Secondary | ICD-10-CM

## 2018-06-08 DIAGNOSIS — F1010 Alcohol abuse, uncomplicated: Secondary | ICD-10-CM | POA: Insufficient documentation

## 2018-06-08 DIAGNOSIS — Z720 Tobacco use: Secondary | ICD-10-CM

## 2018-06-08 DIAGNOSIS — Z79899 Other long term (current) drug therapy: Secondary | ICD-10-CM | POA: Insufficient documentation

## 2018-06-08 LAB — CBC
HCT: 42.3 % (ref 39.0–52.0)
HEMOGLOBIN: 14.4 g/dL (ref 13.0–17.0)
MCH: 34.4 pg — AB (ref 26.0–34.0)
MCHC: 34 g/dL (ref 30.0–36.0)
MCV: 101.2 fL — ABNORMAL HIGH (ref 80.0–100.0)
NRBC: 0 % (ref 0.0–0.2)
PLATELETS: 224 10*3/uL (ref 150–400)
RBC: 4.18 MIL/uL — AB (ref 4.22–5.81)
RDW: 11.5 % (ref 11.5–15.5)
WBC: 5 10*3/uL (ref 4.0–10.5)

## 2018-06-08 LAB — BASIC METABOLIC PANEL
ANION GAP: 14 (ref 5–15)
BUN: 6 mg/dL (ref 6–20)
CALCIUM: 8.7 mg/dL — AB (ref 8.9–10.3)
CO2: 19 mmol/L — ABNORMAL LOW (ref 22–32)
Chloride: 103 mmol/L (ref 98–111)
Creatinine, Ser: 0.71 mg/dL (ref 0.61–1.24)
GLUCOSE: 102 mg/dL — AB (ref 70–99)
POTASSIUM: 3.9 mmol/L (ref 3.5–5.1)
SODIUM: 136 mmol/L (ref 135–145)

## 2018-06-08 LAB — TROPONIN I

## 2018-06-08 MED ORDER — SODIUM CHLORIDE 0.9% FLUSH: 3.0000 mL | Freq: Once | INTRAVENOUS | Status: DC

## 2018-06-08 NOTE — ED Notes (Signed)

## 2018-06-08 NOTE — ED Notes (Signed)
Pt's mom/wife called seeking medical information. No information provided. Pt asked for permission to let his family know his condition and plan. Pt given permission and wife called with no answer.

## 2018-06-08 NOTE — ED Provider Notes (Signed)
[facility] Emergency Department Provider Note  ____________________________________________  Time seen: Approximately 4:45 PM  I have reviewed the triage vital signs and the nursing notes.   HISTORY  Chief Complaint Chest Pain    HPI Nathan L Dryden Tapley. is a 38 y.o. male with a history of ongoing tobacco abuse, alcohol abuse, presenting with anxiety and chest pain.  The patient reports that whenever he has anxiety, he drinks alcohol "take the edge off."  Today, the police came to his door with a search warrant and he developed a panic attack associated with a central chest "soreness."  He did not have any shortness of breath, lightheadedness or fainting, palpitations.  He has not had any recent illness.  I reviewed the patient's chart and in 2017 he was admitted for atypical chest pain with a reassuring cardiac work-up as well.  FH: No family history of early CAD.  No family history of sudden cardiac death.  SH: + ETOH and tobacco  History reviewed. No pertinent past medical history.  Patient Active Problem List   Diagnosis Date Noted  . Rhabdomyolysis 05/13/2015    Past Surgical History:  Procedure Laterality Date  . HAND SURGERY      Current Outpatient Rx  . Order #: [id number] Class: Print    Allergies Patient has no known allergies.  Family History  Problem Relation Age of Onset  . Hypertension Maternal Grandmother     Social History Social History   Tobacco Use  . Smoking status: Current Every Day Smoker    Packs/day: 0.50  . Smokeless tobacco: Never Used  Substance Use Topics  . Alcohol use: Yes    Alcohol/week: 12.0 standard drinks    Types: 12 Cans of beer per week  . Drug use: No    Review of Systems Constitutional: No fever/chills. Eyes: No visual changes. ENT: No sore throat. No congestion or rhinorrhea. Cardiovascular: Positive chest pain. Denies palpitations. Respiratory: Denies shortness of breath.  No  cough. Gastrointestinal: No abdominal pain.  No nausea, no vomiting.  No diarrhea.  No constipation. Genitourinary: Negative for dysuria. Musculoskeletal: Negative for back pain.  No lower extremity swelling or calf pain. Skin: Negative for rash. Neurological: Negative for headaches. No focal numbness, tingling or weakness.  Psych: Positive anxiety and panic attack.  Positive alcohol abuse.     ____________________________________________   PHYSICAL EXAM:  VITAL SIGNS: ED Triage Vitals  Enc Vitals Group     BP 06/08/18 1351 102/67     Pulse Rate 06/08/18 1351 93     Resp 06/08/18 1351 16     Temp 06/08/18 1351 97.9 F (36.6 C)     Temp Source 06/08/18 1351 Oral     SpO2 06/08/18 1351 97 %     Weight --      Height --      Head Circumference --      Peak Flow --      Pain Score 06/08/18 1352 0     Pain Loc --      Pain Edu? --      Excl. in GC? --     Constitutional: Alert and oriented.  Answers questions appropriately.  The patient appears mildly inebriated but is able to speak in full sentences and walk without any difficulty. Eyes: Conjunctivae are injected bilaterally..  EOMI. No scleral icterus. Head: Atraumatic. Nose: No congestion/rhinnorhea. Mouth/Throat: Mucous membranes are moist.  Neck: No stridor.  Supple.  No JVD.  No meningismus. Cardiovascular: Normal rate,  regular rhythm. No murmurs, rubs or gallops.  Respiratory: Normal respiratory effort.  No accessory muscle use or retractions. Lungs CTAB.  No wheezes, rales or ronchi. Gastrointestinal: Soft, nontender and nondistended.  No guarding or rebound.  No peritoneal signs. Musculoskeletal: No LE edema. No ttp in the calves or palpable cords.  Negative Homan's sign. Neurologic:  A&Ox3.  Speech is clear.  Face and smile are symmetric.  EOMI.  Moves all extremities well. Skin:  Skin is warm, dry and intact. No rash noted. Psychiatric: Mood and affect are normal. Speech and behavior are normal.  Normal  judgement  ____________________________________________   LABS (all labs ordered are listed, but only abnormal results are displayed)  Labs Reviewed  BASIC METABOLIC PANEL - Abnormal; Notable for the following components:      Result Value   CO2 19 (*)    Glucose, Bld 102 (*)    Calcium 8.7 (*)    All other components within normal limits  CBC - Abnormal; Notable for the following components:   RBC 4.18 (*)    MCV 101.2 (*)    MCH 34.4 (*)    All other components within normal limits  TROPONIN I   ____________________________________________  EKG  ED ECG REPORT I, Anne-Caroline Sharma Covert, the attending physician, personally viewed and interpreted this ECG.   Date: 06/08/2018  EKG Time: 1348  Rate: 93  Rhythm: normal sinus rhythm  Axis: normal  Intervals:none  ST&T Change: No STEMI; does have a straight morphology in his ST-T segment without any ST elevation or depression.  This EKG was compared to prior, which shows the same morphology and is unchanged.  ____________________________________________  RADIOLOGY  Dg Chest 2 View  Result Date: 06/08/2018 CLINICAL DATA:  Chest pain EXAM: CHEST - 2 VIEW COMPARISON:  May 13, 2015 FINDINGS: There is no edema or consolidation. The heart size and pulmonary vascularity are normal. No adenopathy. There is an exostosis arising from the inferior aspect of the lateral right clavicle, an apparent anatomic variant. IMPRESSION: No edema or consolidation. Electronically Signed   By: Bretta Bang III M.D.   On: 06/08/2018 14:37    ____________________________________________   PROCEDURES  Procedure(s) performed: None  Procedures  Critical Care performed: No ____________________________________________   INITIAL IMPRESSION / ASSESSMENT AND PLAN / ED COURSE  Pertinent labs & imaging results that were available during my care of the patient were reviewed by me and considered in my medical decision making (see chart for  details).  38 y.o. male with a history of anxiety and polysubstance abuse presenting with chest pain and anxiety.  Overall, the patient is hemodynamically stable.  His EKG is unchanged from prior.  He has a negative troponin and reassuring laboratory studies.  The patient's chest x-ray does not show any acute process.  His chest pain may be due to his anxiety.  I would also consider gastritis, reflux or peptic ulcer disease especially given the large quantity of alcohol he is describing that he drinks.  However, his epigastric area is nontender on my exam.  I have encouraged him to establish a primary care physician at the [facility] clinic for follow-up.  At this time, the patient is safe for discharge home.  Return precautions were discussed.  ____________________________________________  FINAL CLINICAL IMPRESSION(S) / ED DIAGNOSES  Final diagnoses:  Anxiety  Chest pain, unspecified type  Alcohol abuse  Tobacco abuse         NEW MEDICATIONS STARTED DURING THIS VISIT:  New Prescriptions  No medications on file      Rockne Menghini, MD 06/08/18 [phone]

## 2018-06-08 NOTE — Discharge Instructions (Addendum)
Please return to the emergency department if you develop severe pain, lightheadedness or fainting, shortness of breath, fever, or any other symptoms concerning to you. °

## 2018-06-08 NOTE — ED Triage Notes (Signed)
PT to ED via [employer]. PT was given search warrant by officer when pt states he started having CP. PT denies any drug use, states ETOH today aprox 2beers. Pt A&Ox4 but appears drowsy. VSS

## 2018-06-08 NOTE — ED Notes (Addendum)
First nurse note: pt brought to ED via [facility] in custody of [employer]. Pt was in process of being arrested for crack cocaine and began to have chest pain. 24G PIV placed by EMS, pt given approx. 150 mL NS prior to arrival for BP 96/50.

## 2020-05-25 IMAGING — CR DG CHEST 2V
1 series · 2 of 2 positions shown · non-contrast
Comparison: May 13, 2015

CLINICAL DATA: Chest pain

EXAM:
CHEST - 2 VIEW

[Series 1: dg chest 2 view · 0.14mm/px · 2 of 2 slices shown]
[im 1/2]
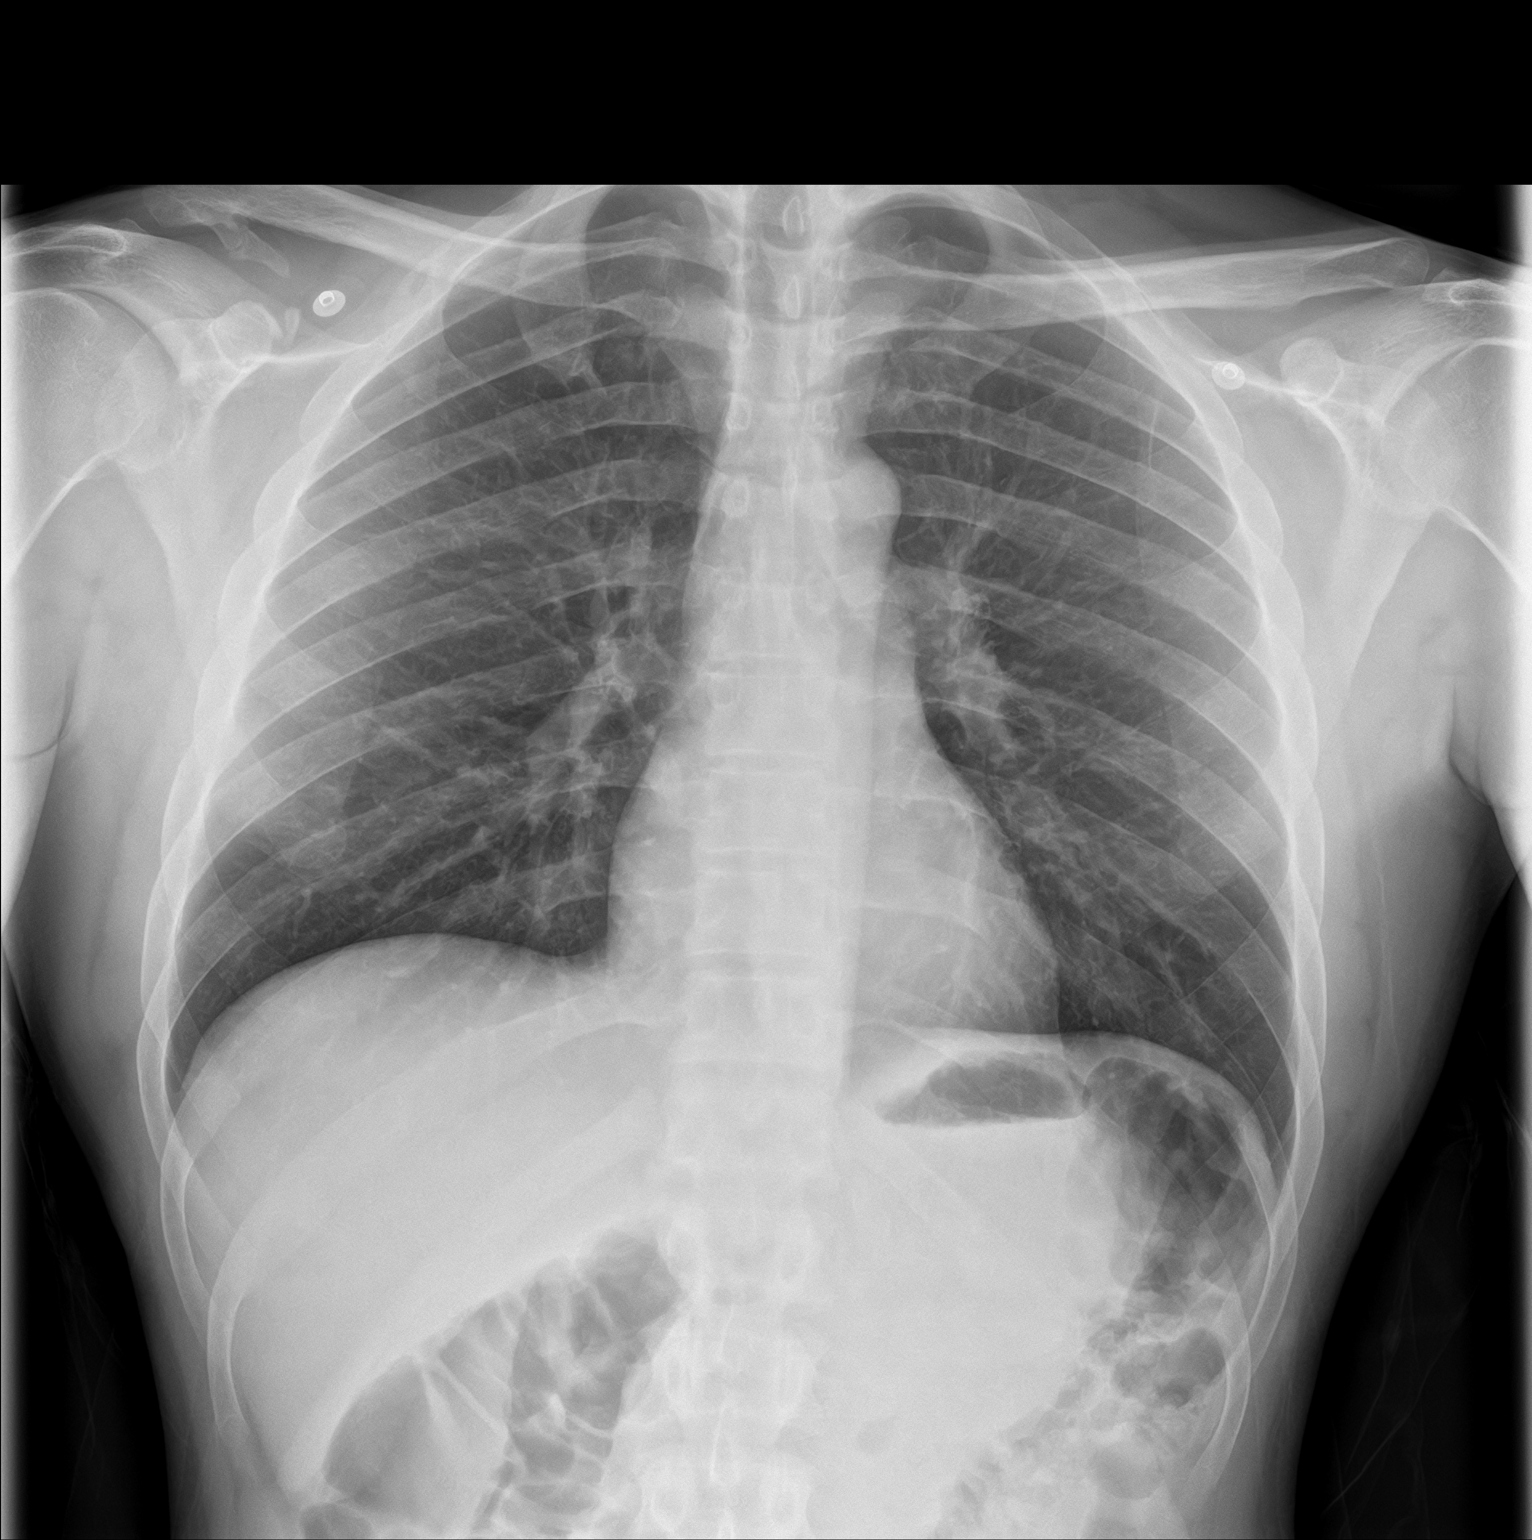
[im 2/2]
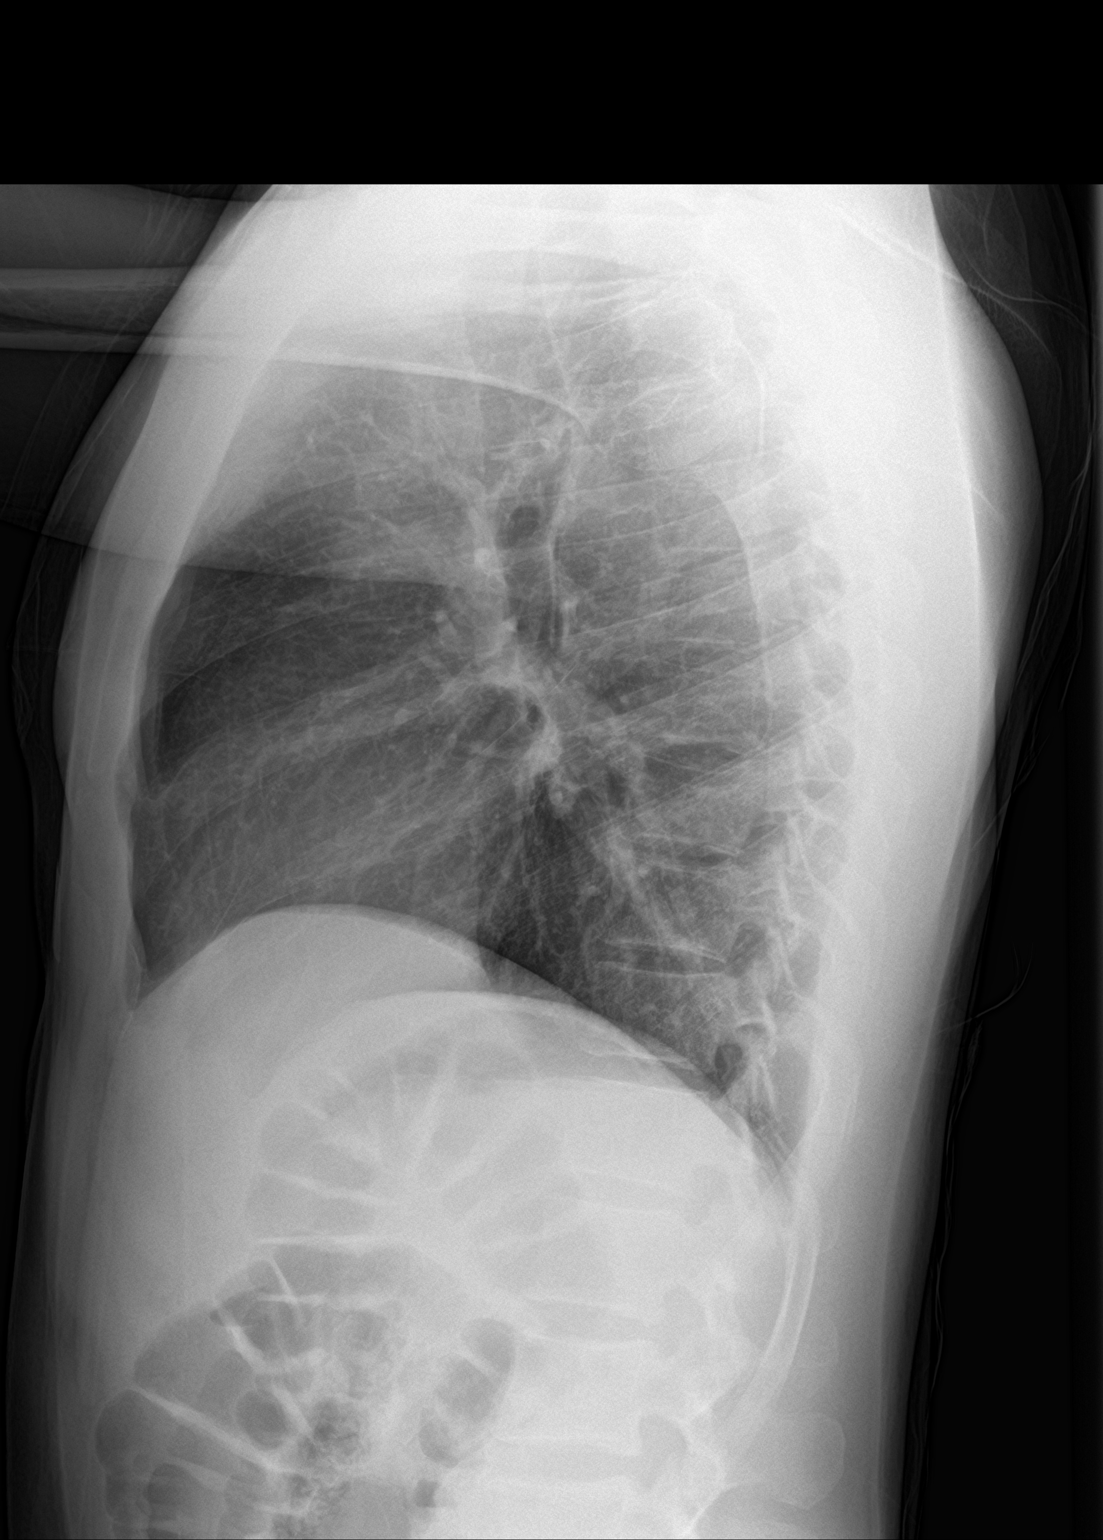

[2 of 2 positions shown; findings below may reference images not displayed]

FINDINGS: There is no edema or consolidation. The heart size and pulmonary
vascularity are normal. No adenopathy. There is an exostosis arising
from the inferior aspect of the lateral right clavicle, an apparent
anatomic variant.
IMPRESSION: No edema or consolidation.
